# Patient Record
Sex: Female | Born: 1960 | Hispanic: Yes | Marital: Married
Health system: Southern US, Community
[De-identification: ages and names within clinical notes are randomized; demographics above are authoritative.]

---

## 1998-03-20 ENCOUNTER — Emergency Department (HOSPITAL_COMMUNITY): Admission: EM | Admit: 1998-03-20 | Discharge: 1998-03-20 | Payer: Self-pay | Admitting: Emergency Medicine

## 2013-08-27 ENCOUNTER — Ambulatory Visit: Payer: Self-pay | Admitting: Family Medicine

## 2013-09-16 ENCOUNTER — Ambulatory Visit: Payer: Self-pay | Admitting: Family Medicine

## 2013-10-05 ENCOUNTER — Ambulatory Visit: Payer: Self-pay | Admitting: Family Medicine

## 2013-12-12 IMAGING — US ULTRASOUND RIGHT BREAST
1 series · 4 of 4 positions shown · non-contrast
Comparison: 09/16/2013 baseline screening mammogram.

CLINICAL DATA: 52-year-old female with abnormal screening mammogram
-possible right breast mass and bilateral breast calcifications.

EXAM:
DIGITAL DIAGNOSTIC  BILATERAL MAMMOGRAM
ULTRASOUND RIGHT BREAST

[Series 1: ultrasound right breast · 0.08mm/px · 4 of 4 slices shown]
[im 1/4]
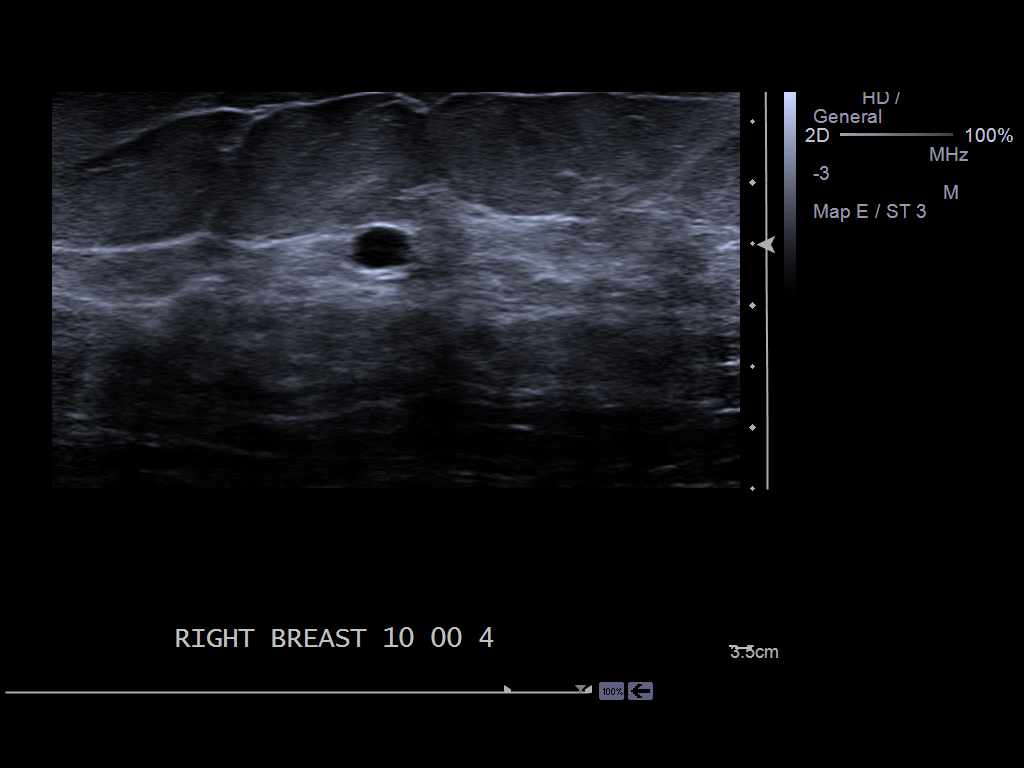
[im 2/4]
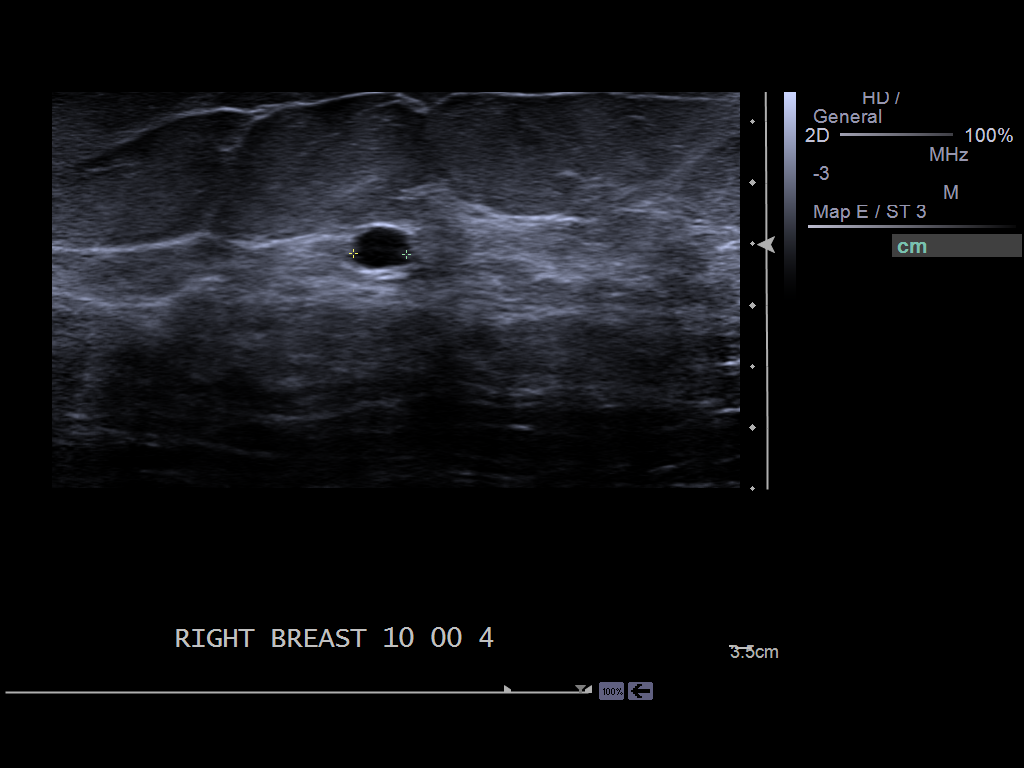
[im 3/4]
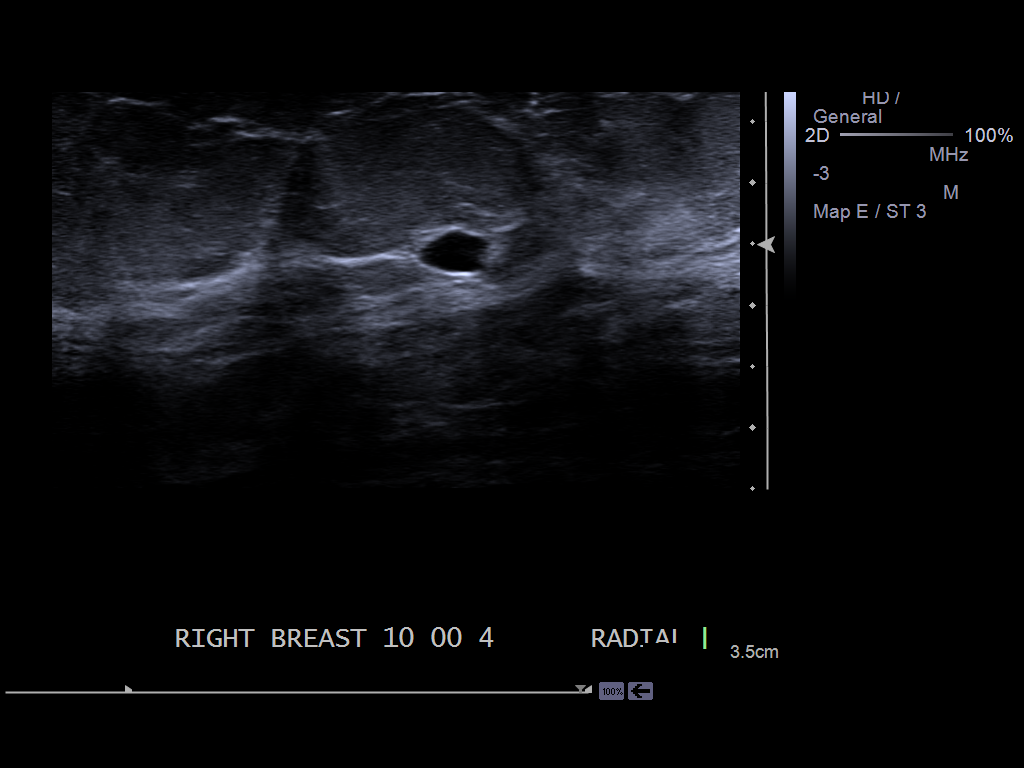
[im 4/4]
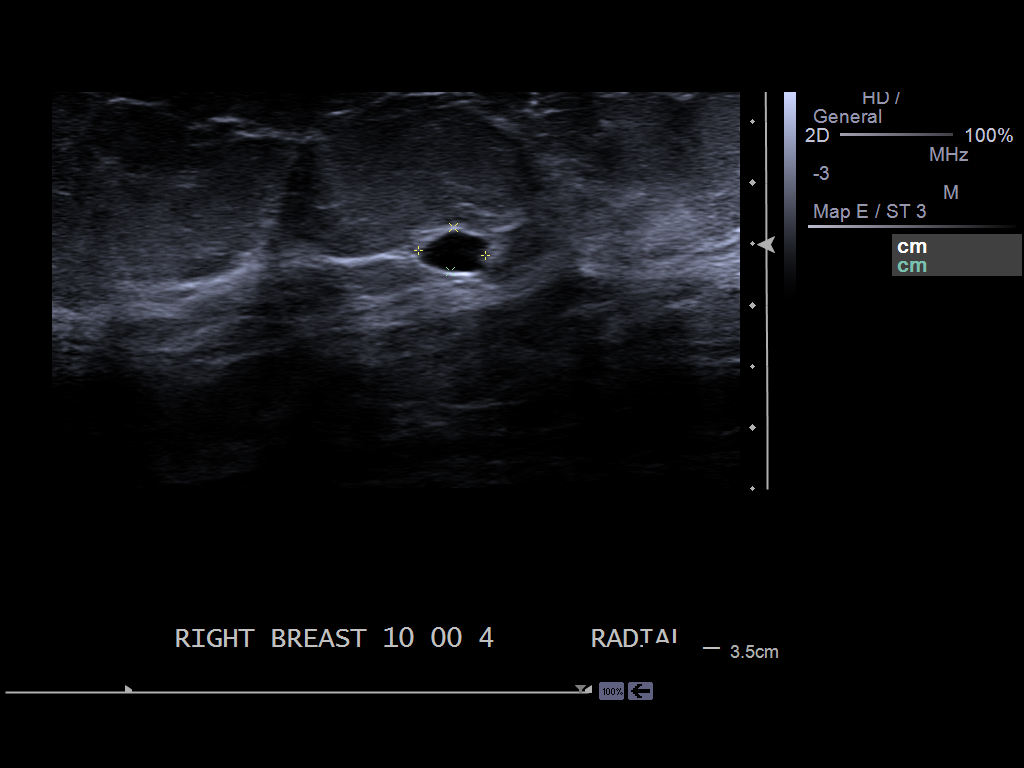

[4 of 4 positions shown; findings below may reference images not displayed]

ACR Breast Density Category c: The breasts are heterogeneously
dense, which may obscure small masses.
FINDINGS: Magnification views of both breasts and a spot compression views of
the right breast performed in

5 mm circumscribed oval mass within the upper-outer right breast is
identified.

A loose group of slightly coarse and round calcifications within
lower right breast measuring 18 x 24 x 24 mm and a similar-appearing
group of calcifications within the slightly outer upper left breast
are identified. There is a suggestion of some layering of these
calcifications.

On physical exam no palpable abnormalities are identified within the
upper-outer right breast

Ultrasound is performed, showing a 6 x 4 x 4 mm simple cyst 10
o'clock position of the right breast 4 cm from the nipple,
corresponding to the mammographic finding.
IMPRESSION: Likely benign bilateral breast calcifications. Six-month followup is
recommended to ensure stability.

6 x 4 x 4 mm benign simple cyst in the upper-outer right breast
corresponding to the screening study finding.

RECOMMENDATION:
Bilateral diagnostic mammograms with magnification views in 6
months.

I have discussed the findings and recommendations with the patient.
Results were also provided in writing at the conclusion of the
visit. If applicable, a reminder letter will be sent to the patient
regarding the next appointment.

BI-RADS CATEGORY  3: Probably benign finding(s) - short interval
follow-up suggested.

## 2014-05-11 ENCOUNTER — Ambulatory Visit: Payer: Self-pay | Admitting: Physician Assistant

## 2015-08-04 ENCOUNTER — Other Ambulatory Visit: Payer: Self-pay | Admitting: Physician Assistant

## 2015-08-04 DIAGNOSIS — R921 Mammographic calcification found on diagnostic imaging of breast: Secondary | ICD-10-CM

## 2015-08-11 ENCOUNTER — Other Ambulatory Visit: Payer: Self-pay | Admitting: Physician Assistant

## 2015-08-11 DIAGNOSIS — R921 Mammographic calcification found on diagnostic imaging of breast: Secondary | ICD-10-CM

## 2015-08-31 ENCOUNTER — Ambulatory Visit
Admission: RE | Admit: 2015-08-31 | Discharge: 2015-08-31 | Disposition: A | Discharge status: Home / Self Care | Payer: Commercial Managed Care - HMO | Source: Ambulatory Visit | Attending: Physician Assistant | Admitting: Physician Assistant

## 2015-08-31 DIAGNOSIS — R921 Mammographic calcification found on diagnostic imaging of breast: Secondary | ICD-10-CM

## 2016-12-28 DIAGNOSIS — Z124 Encounter for screening for malignant neoplasm of cervix: Secondary | ICD-10-CM | POA: Diagnosis not present

## 2016-12-28 DIAGNOSIS — Z008 Encounter for other general examination: Secondary | ICD-10-CM | POA: Diagnosis not present

## 2016-12-28 DIAGNOSIS — R7309 Other abnormal glucose: Secondary | ICD-10-CM | POA: Diagnosis not present

## 2016-12-28 DIAGNOSIS — Z0000 Encounter for general adult medical examination without abnormal findings: Secondary | ICD-10-CM | POA: Diagnosis not present

## 2016-12-28 DIAGNOSIS — Z1159 Encounter for screening for other viral diseases: Secondary | ICD-10-CM | POA: Diagnosis not present

## 2016-12-31 ENCOUNTER — Other Ambulatory Visit: Payer: Self-pay | Admitting: Physician Assistant

## 2016-12-31 DIAGNOSIS — Z1231 Encounter for screening mammogram for malignant neoplasm of breast: Secondary | ICD-10-CM

## 2017-01-16 DIAGNOSIS — Z124 Encounter for screening for malignant neoplasm of cervix: Secondary | ICD-10-CM | POA: Diagnosis not present

## 2017-01-18 DIAGNOSIS — Z124 Encounter for screening for malignant neoplasm of cervix: Secondary | ICD-10-CM | POA: Diagnosis not present

## 2017-01-24 ENCOUNTER — Ambulatory Visit
Admission: RE | Admit: 2017-01-24 | Discharge: 2017-01-24 | Disposition: A | Discharge status: Home / Self Care | Payer: 59 | Source: Ambulatory Visit | Attending: Physician Assistant | Admitting: Physician Assistant

## 2017-01-24 DIAGNOSIS — Z1231 Encounter for screening mammogram for malignant neoplasm of breast: Secondary | ICD-10-CM

## 2017-09-14 DIAGNOSIS — S52615A Nondisplaced fracture of left ulna styloid process, initial encounter for closed fracture: Secondary | ICD-10-CM | POA: Diagnosis not present

## 2017-09-14 DIAGNOSIS — M79632 Pain in left forearm: Secondary | ICD-10-CM | POA: Diagnosis not present

## 2017-09-17 DIAGNOSIS — S52572A Other intraarticular fracture of lower end of left radius, initial encounter for closed fracture: Secondary | ICD-10-CM | POA: Diagnosis not present

## 2017-09-17 DIAGNOSIS — S52612A Displaced fracture of left ulna styloid process, initial encounter for closed fracture: Secondary | ICD-10-CM | POA: Diagnosis not present

## 2017-09-24 DIAGNOSIS — S52572D Other intraarticular fracture of lower end of left radius, subsequent encounter for closed fracture with routine healing: Secondary | ICD-10-CM | POA: Diagnosis not present

## 2017-09-24 DIAGNOSIS — S52572A Other intraarticular fracture of lower end of left radius, initial encounter for closed fracture: Secondary | ICD-10-CM | POA: Diagnosis not present

## 2017-09-24 DIAGNOSIS — S52612D Displaced fracture of left ulna styloid process, subsequent encounter for closed fracture with routine healing: Secondary | ICD-10-CM | POA: Diagnosis not present

## 2017-10-10 DIAGNOSIS — M25562 Pain in left knee: Secondary | ICD-10-CM | POA: Diagnosis not present

## 2017-10-10 DIAGNOSIS — M1711 Unilateral primary osteoarthritis, right knee: Secondary | ICD-10-CM | POA: Diagnosis not present

## 2017-10-10 DIAGNOSIS — S52572D Other intraarticular fracture of lower end of left radius, subsequent encounter for closed fracture with routine healing: Secondary | ICD-10-CM | POA: Diagnosis not present

## 2017-11-06 DIAGNOSIS — S52572D Other intraarticular fracture of lower end of left radius, subsequent encounter for closed fracture with routine healing: Secondary | ICD-10-CM | POA: Diagnosis not present

## 2017-12-04 DIAGNOSIS — S52572D Other intraarticular fracture of lower end of left radius, subsequent encounter for closed fracture with routine healing: Secondary | ICD-10-CM | POA: Diagnosis not present

## 2017-12-04 DIAGNOSIS — M1711 Unilateral primary osteoarthritis, right knee: Secondary | ICD-10-CM | POA: Diagnosis not present

## 2018-08-23 DIAGNOSIS — Z23 Encounter for immunization: Secondary | ICD-10-CM | POA: Diagnosis not present

## 2020-01-11 ENCOUNTER — Other Ambulatory Visit: Payer: Self-pay | Admitting: Physician Assistant

## 2020-01-11 DIAGNOSIS — Z1231 Encounter for screening mammogram for malignant neoplasm of breast: Secondary | ICD-10-CM

## 2020-01-12 ENCOUNTER — Encounter: Payer: Self-pay | Admitting: Physician Assistant

## 2021-01-26 ENCOUNTER — Other Ambulatory Visit: Payer: Self-pay | Admitting: Physician Assistant

## 2021-01-26 DIAGNOSIS — Z1231 Encounter for screening mammogram for malignant neoplasm of breast: Secondary | ICD-10-CM

## 2021-01-26 DIAGNOSIS — M20001 Unspecified deformity of right finger(s): Secondary | ICD-10-CM

## 2021-01-27 ENCOUNTER — Ambulatory Visit
Admission: RE | Admit: 2021-01-27 | Discharge: 2021-01-27 | Disposition: A | Discharge status: Home / Self Care | Payer: BC Managed Care – PPO | Source: Ambulatory Visit | Attending: Physician Assistant | Admitting: Physician Assistant

## 2021-01-27 ENCOUNTER — Ambulatory Visit
Admission: RE | Admit: 2021-01-27 | Discharge: 2021-01-27 | Disposition: A | Discharge status: Home / Self Care | Payer: BC Managed Care – PPO | Attending: Physician Assistant | Admitting: Physician Assistant

## 2021-01-27 DIAGNOSIS — M20001 Unspecified deformity of right finger(s): Secondary | ICD-10-CM | POA: Diagnosis present

## 2021-02-15 ENCOUNTER — Ambulatory Visit
Admission: RE | Admit: 2021-02-15 | Discharge: 2021-02-15 | Disposition: A | Discharge status: Home / Self Care | Payer: BC Managed Care – PPO | Source: Ambulatory Visit | Attending: Physician Assistant | Admitting: Physician Assistant

## 2021-02-15 ENCOUNTER — Other Ambulatory Visit: Payer: Self-pay

## 2021-02-15 DIAGNOSIS — Z1231 Encounter for screening mammogram for malignant neoplasm of breast: Secondary | ICD-10-CM | POA: Insufficient documentation

## 2021-02-21 ENCOUNTER — Other Ambulatory Visit: Payer: Self-pay | Admitting: Physician Assistant

## 2021-02-21 DIAGNOSIS — N6320 Unspecified lump in the left breast, unspecified quadrant: Secondary | ICD-10-CM

## 2021-02-21 DIAGNOSIS — R928 Other abnormal and inconclusive findings on diagnostic imaging of breast: Secondary | ICD-10-CM

## 2021-02-28 ENCOUNTER — Encounter: Payer: Self-pay | Admitting: *Deleted

## 2021-03-16 ENCOUNTER — Telehealth: Payer: Self-pay | Admitting: *Deleted

## 2021-03-16 NOTE — Telephone Encounter (Signed)
No response from vm's left on 4/20 or 4/28 or result letter sent with # to call to [facility]/ Mammo -  Certified letter sent.

## 2021-04-24 IMAGING — MG MM DIGITAL SCREENING BILAT W/ TOMO AND CAD
8 series · 8 of 24 positions shown · non-contrast
Comparison: Prior films

CLINICAL DATA: Screening.

EXAM:
DIGITAL SCREENING BILATERAL MAMMOGRAM WITH TOMOSYNTHESIS AND CAD
TECHNIQUE: Bilateral screening digital craniocaudal and mediolateral oblique
mammograms were obtained. Bilateral screening digital breast
tomosynthesis was performed. The images were evaluated with
computer-aided detection.

[R MLO synth-2D]
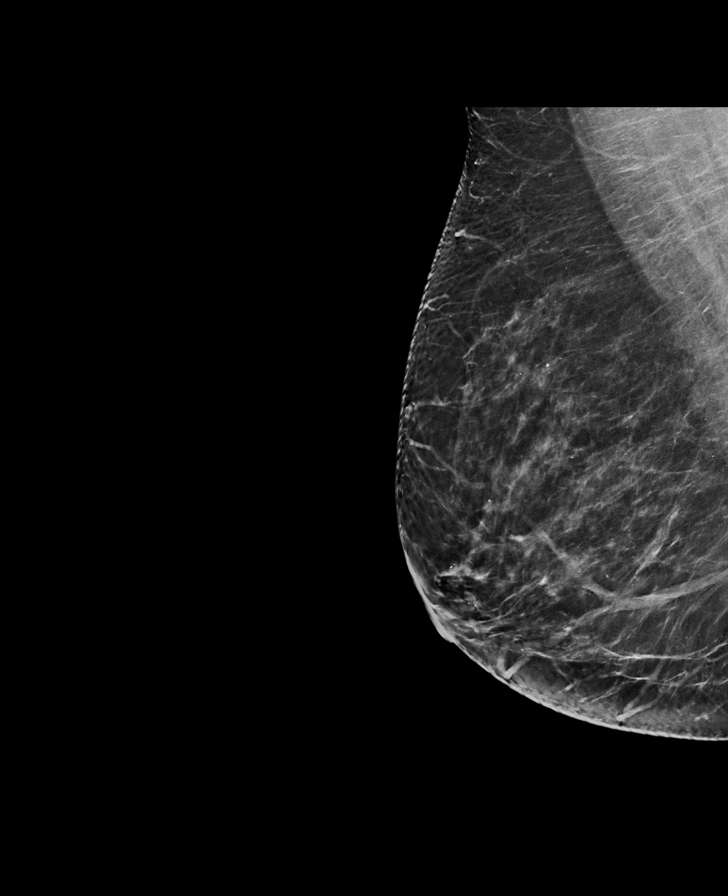

[L MLO synth-2D]
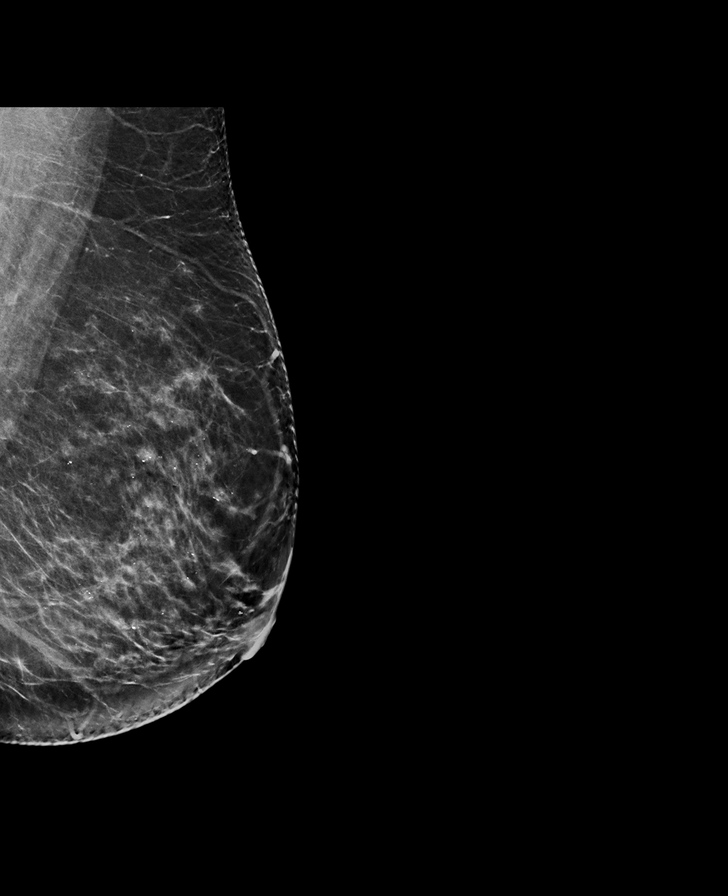

[L CC synth-2D]
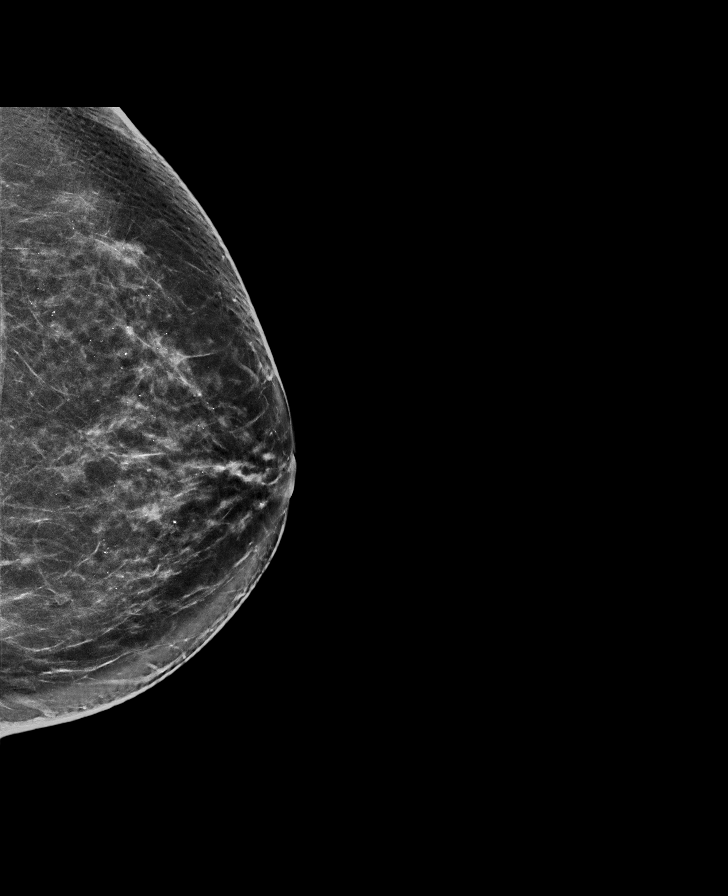

[R CC synth-2D]
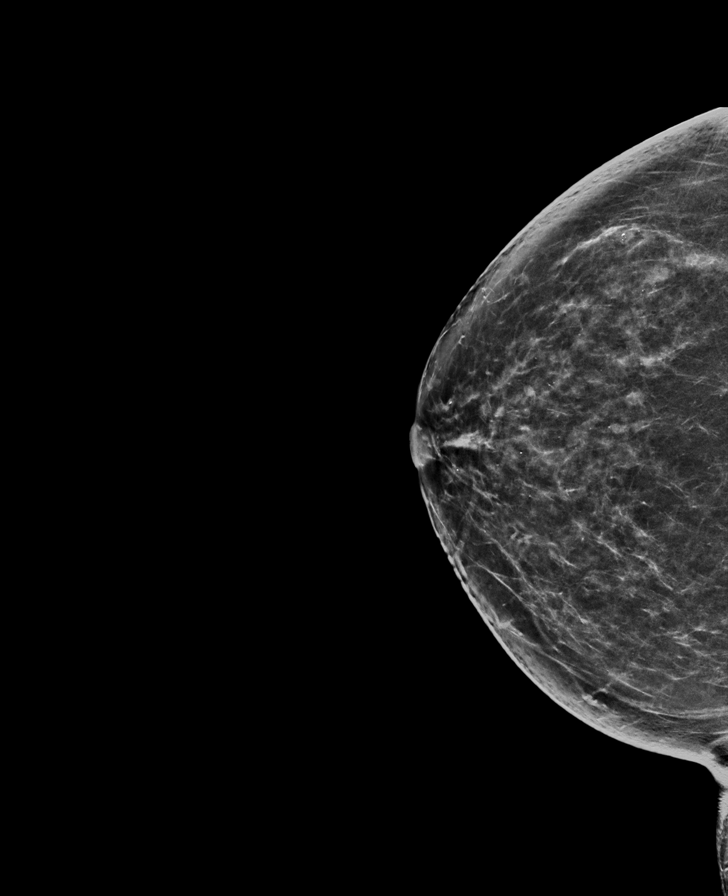

[R CC tomo · tomo slice 37/73.0]
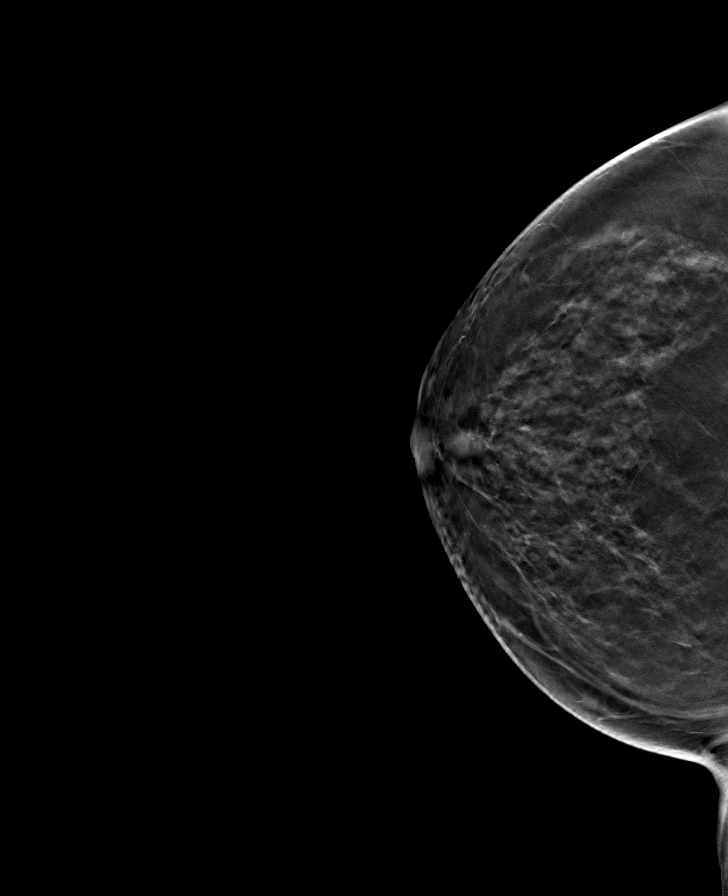

[R MLO tomo · tomo slice 39/77.0]
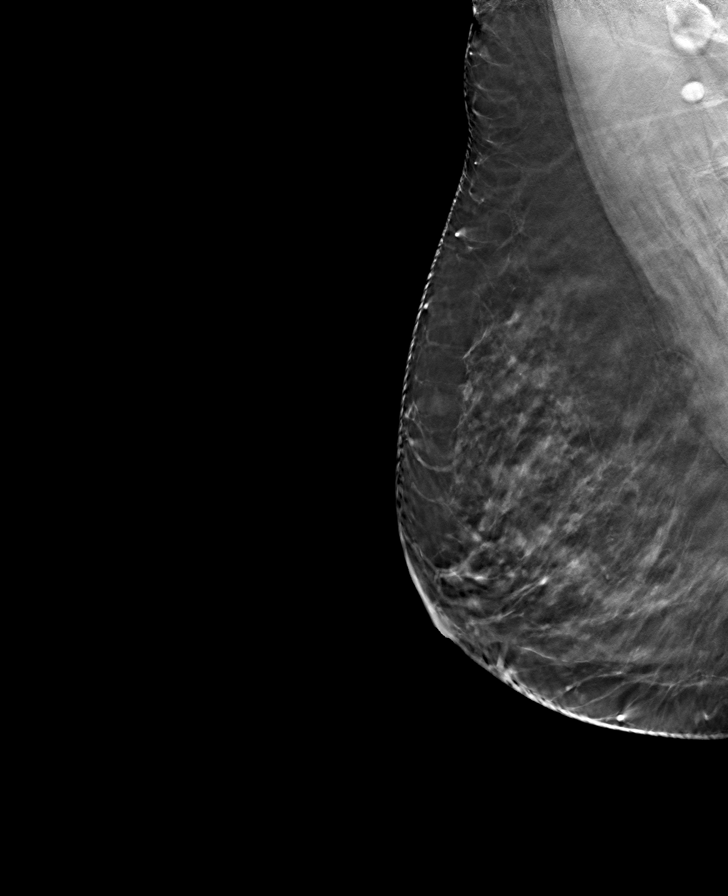

[L MLO tomo · tomo slice 39/78.0]
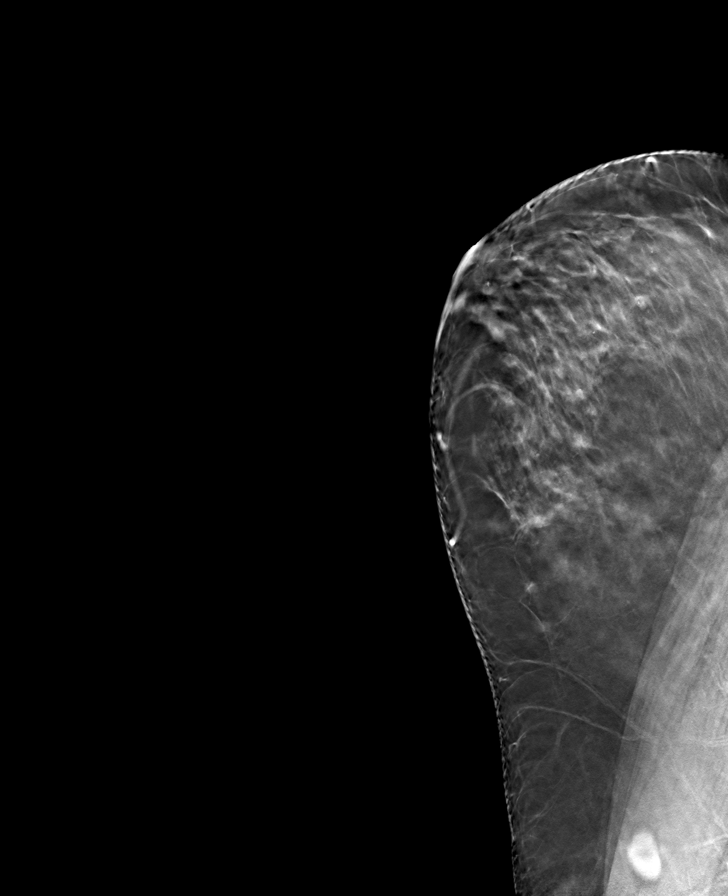

[L CC tomo · tomo slice 41/81.0]
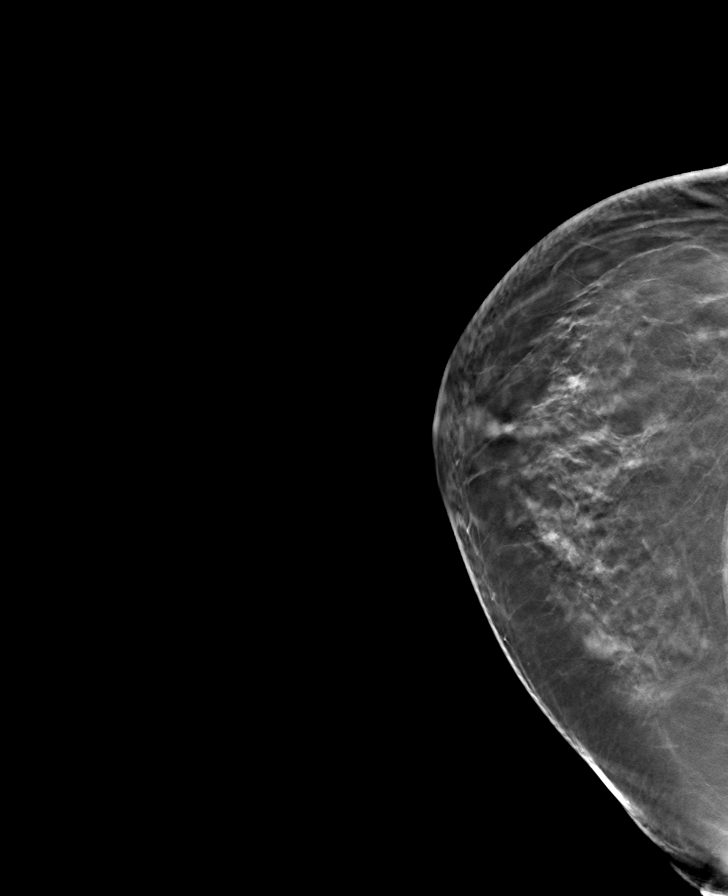

[8 of 24 positions shown; findings below may reference images not displayed]

ACR Breast Density Category b: There are scattered areas of
fibroglandular density.
FINDINGS: In the left breast, a possible mass warrants further evaluation. In
the right breast, no findings suspicious for malignancy.
IMPRESSION: Further evaluation is suggested for a possible mass in the left
breast.

RECOMMENDATION:
Diagnostic mammogram and possibly ultrasound of the left breast.
(Code:[id number]-[id number])

The patient will be contacted regarding the findings, and additional
imaging will be scheduled.

BI-RADS CATEGORY  0: Incomplete. Need additional imaging evaluation
and/or prior mammograms for comparison.

## 2022-12-14 ENCOUNTER — Other Ambulatory Visit: Payer: Self-pay | Admitting: Primary Care

## 2022-12-14 DIAGNOSIS — N63 Unspecified lump in unspecified breast: Secondary | ICD-10-CM

## 2022-12-21 ENCOUNTER — Ambulatory Visit
Admission: RE | Admit: 2022-12-21 | Discharge: 2022-12-21 | Disposition: A | Discharge status: Home / Self Care | Payer: BC Managed Care – PPO | Source: Ambulatory Visit | Attending: Primary Care | Admitting: Primary Care

## 2022-12-21 DIAGNOSIS — N63 Unspecified lump in unspecified breast: Secondary | ICD-10-CM

## 2023-12-10 ENCOUNTER — Other Ambulatory Visit: Payer: Self-pay | Admitting: Physician Assistant

## 2023-12-10 DIAGNOSIS — Z1231 Encounter for screening mammogram for malignant neoplasm of breast: Secondary | ICD-10-CM

## 2023-12-30 ENCOUNTER — Ambulatory Visit
Admission: RE | Admit: 2023-12-30 | Discharge: 2023-12-30 | Disposition: A | Discharge status: Home / Self Care | Payer: Worker's Compensation | Source: Ambulatory Visit | Attending: Physician Assistant | Admitting: Physician Assistant

## 2023-12-30 ENCOUNTER — Other Ambulatory Visit: Payer: Self-pay | Admitting: Physician Assistant

## 2023-12-30 DIAGNOSIS — M533 Sacrococcygeal disorders, not elsewhere classified: Secondary | ICD-10-CM | POA: Insufficient documentation

## 2023-12-30 DIAGNOSIS — Y990 Civilian activity done for income or pay: Secondary | ICD-10-CM

## 2023-12-30 DIAGNOSIS — W19XXXA Unspecified fall, initial encounter: Secondary | ICD-10-CM | POA: Diagnosis not present

## 2023-12-30 DIAGNOSIS — M5459 Other low back pain: Secondary | ICD-10-CM | POA: Insufficient documentation
